# Patient Record
Sex: Male | Born: 1960 | Race: White | Hispanic: No | Marital: Married | Smoking: Former smoker
Health system: Southern US, Academic
[De-identification: ages and names within clinical notes are randomized; demographics above are authoritative.]

## PROBLEM LIST (undated history)

## (undated) DIAGNOSIS — I10 Essential (primary) hypertension: Secondary | ICD-10-CM

## (undated) DIAGNOSIS — N200 Calculus of kidney: Secondary | ICD-10-CM

## (undated) DIAGNOSIS — E785 Hyperlipidemia, unspecified: Secondary | ICD-10-CM

## (undated) DIAGNOSIS — N189 Chronic kidney disease, unspecified: Secondary | ICD-10-CM

## (undated) DIAGNOSIS — H8090 Unspecified otosclerosis, unspecified ear: Secondary | ICD-10-CM

## (undated) HISTORY — PX: LITHOTRIPSY: SUR834

---

## 1989-07-08 ENCOUNTER — Inpatient Hospital Stay (HOSPITAL_COMMUNITY): Payer: Self-pay

## 2019-08-15 IMAGING — MR MRI UPPER EXTREMITY WITHOUT AND WITH CONTRAST LT
12 series · 39 of 40 positions shown · IV contrast (gadavist)
Comparison: None available.

EXAM:  MRI UPPER EXTREMITY WITHOUT AND WITH CONTRAST LT
INDICATION: Bony lesion.
TECHNIQUE: Multiplanar multisequential MRI of the left upper arm was performed without and with 5 mL of Gadavist.

[Series 6: T1 · oblique · left · 5.0mm · 0.66mm/px · 3 of 16 slices shown (1 of 6)]
[im 1/16]
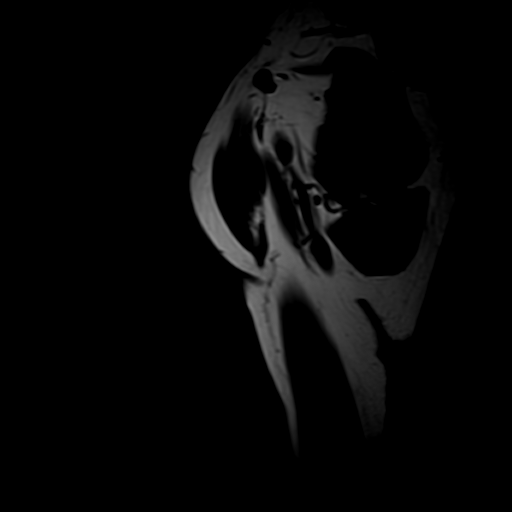
[im 8/16]
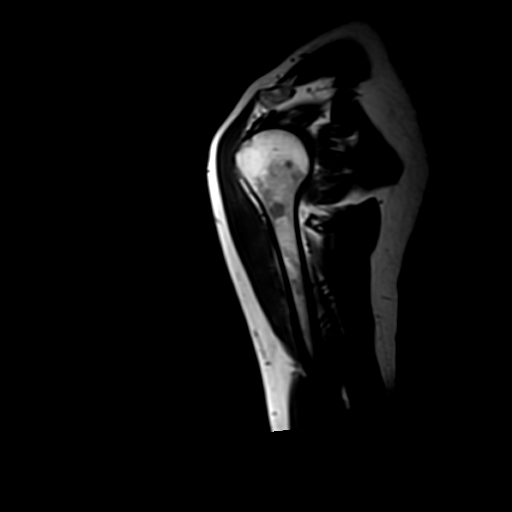
[im 16/16]
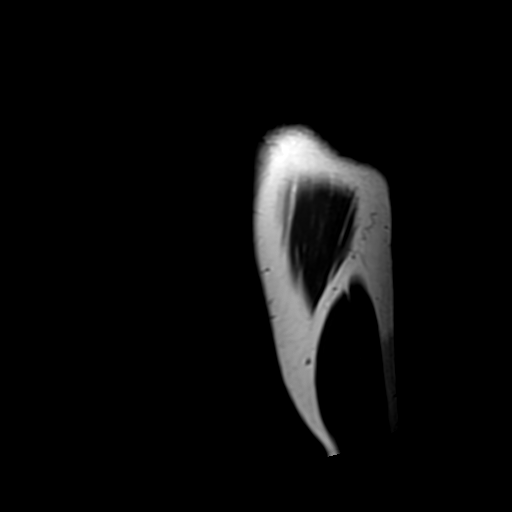

[Series 10: T2 fat-sat · axial · left · 9.0mm · 0.78mm/px · z∈[-212,+108]mm · 6 of 36 slices shown]
[im 1/36]
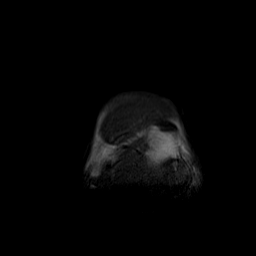
[im 8/36]
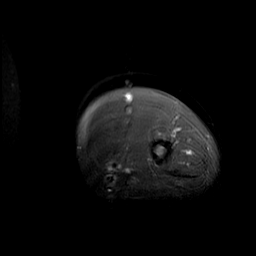
[im 15/36]
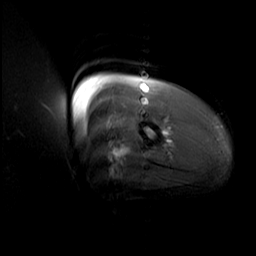
[im 22/36]
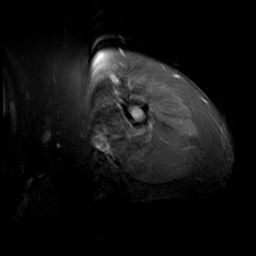
[im 29/36]
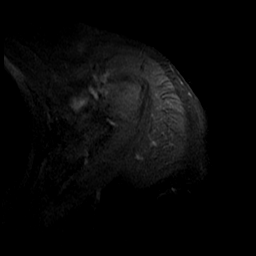
[im 36/36]
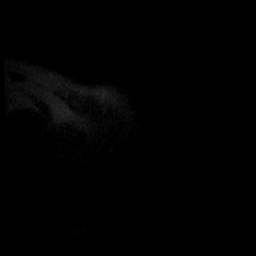

[Series 11: T1 fat-sat · axial · left · 9.0mm · 0.78mm/px · z∈[-102,+108]mm · 4 of 24 slices shown (1 of 4)]
[im 1/24]
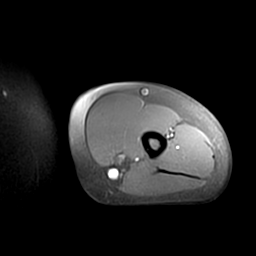
[im 8/24]
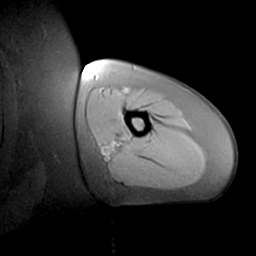
[im 16/24]
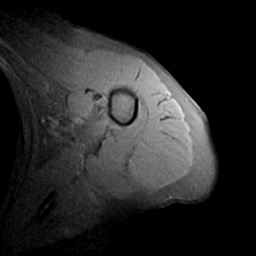
[im 24/24]
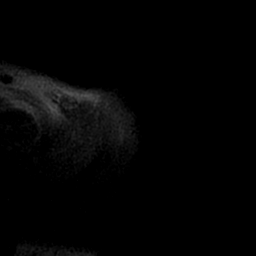

[Series 14: T1 · axial · left · 9.0mm · 0.39mm/px · z∈[-212,+108]mm · 5 of 36 slices shown (2 of 6)]
[im 1/36]
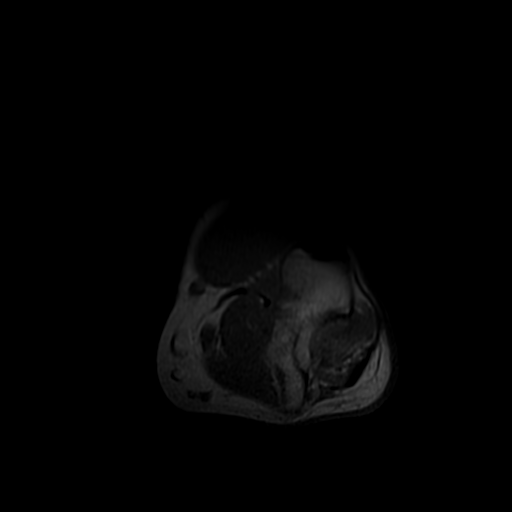
[im 9/36]
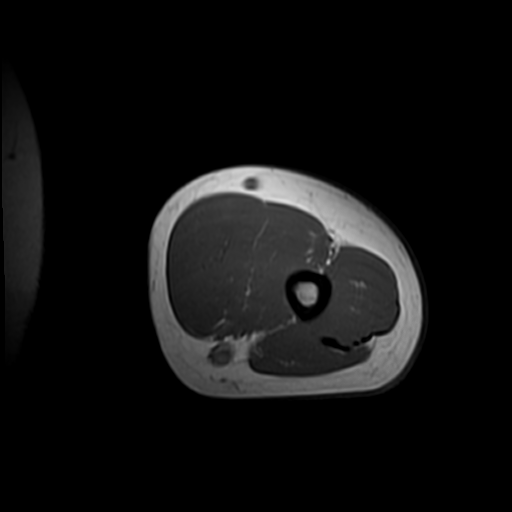
[im 18/36]
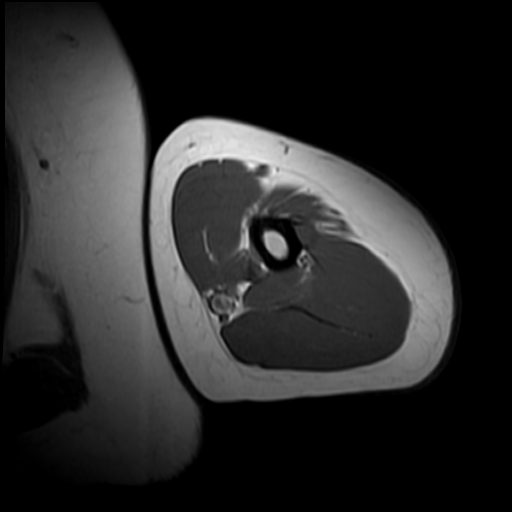
[im 27/36]
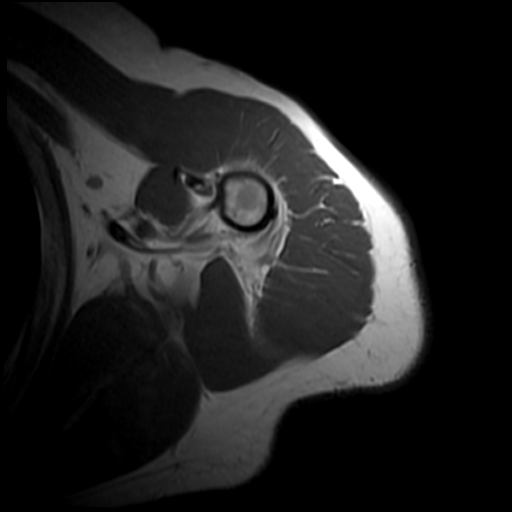
[im 36/36]
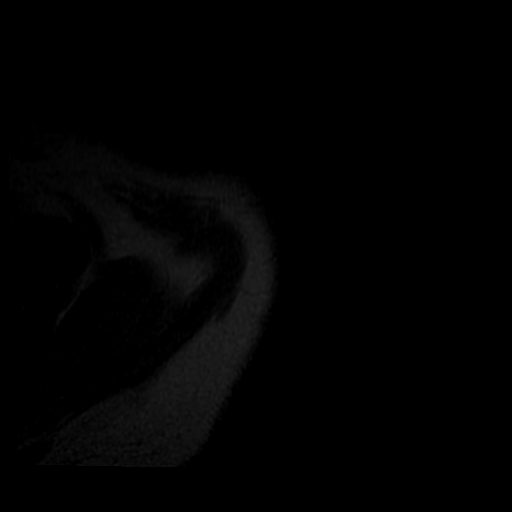

[Series 25: T1 fat-sat · oblique · left · 5.0mm · 1.33mm/px · 2 of 16 slices shown (2 of 4)]
[im 1/16]
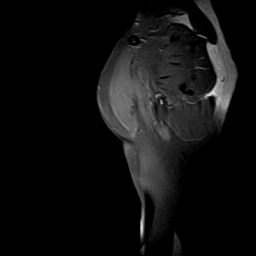
[im 16/16]
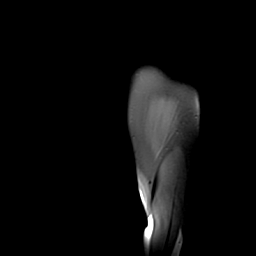

[Series 26: STIR · coronal · left · 4.0mm · 1.37mm/px · 2 of 24 slices shown]
[im 1/24]
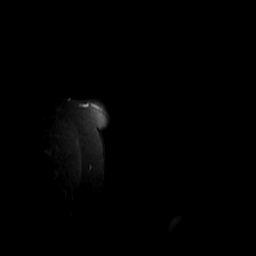
[im 12/24]
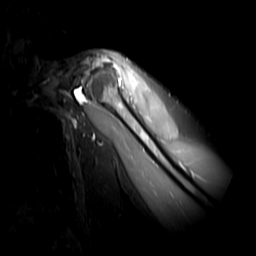

[Series 27: T1 · coronal · left · 4.0mm · 0.68mm/px · 3 of 24 slices shown (3 of 6)]
[im 1/24]
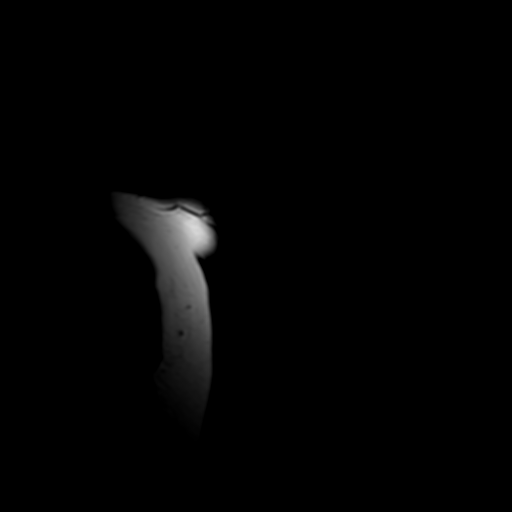
[im 12/24]
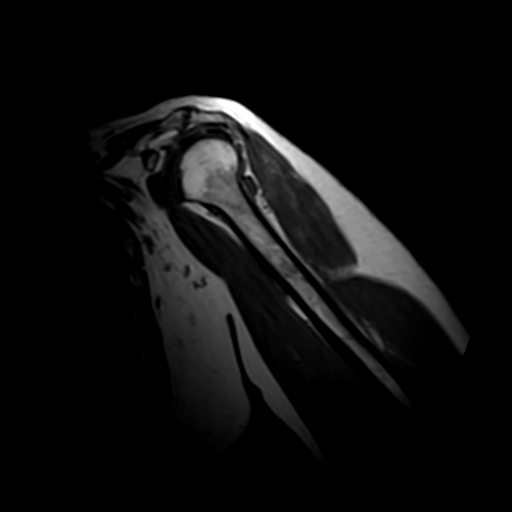
[im 24/24]
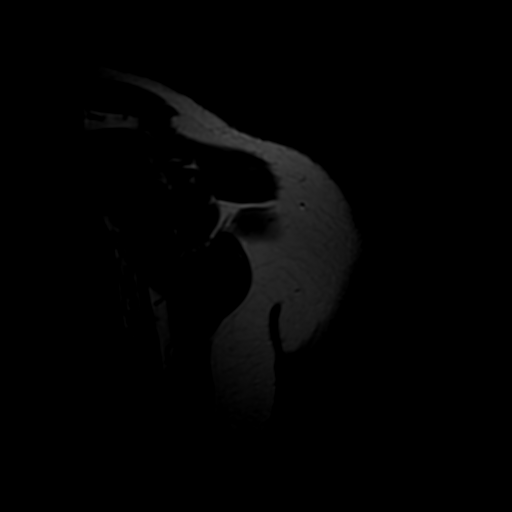

[Series 28: T1 fat-sat · coronal · left · 4.0mm · 1.37mm/px · 3 of 24 slices shown (3 of 4)]
[im 1/24]
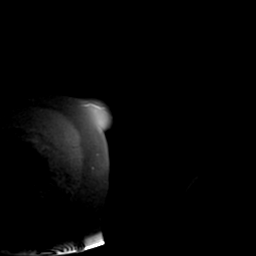
[im 12/24]
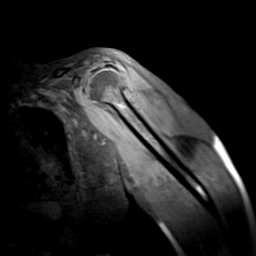
[im 24/24]
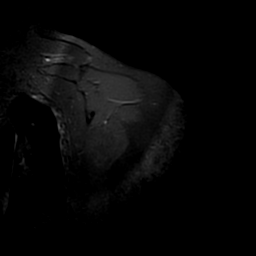

[Series 29: T1 fat-sat · axial · left · 6.0mm · 0.78mm/px · z∈[-119,-8]mm · 3 of 20 slices shown (4 of 4)]
[im 1/20]
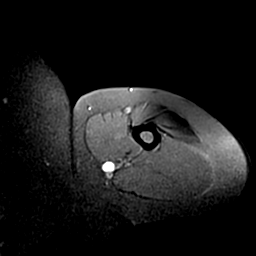
[im 10/20]
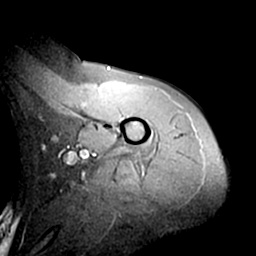
[im 20/20]
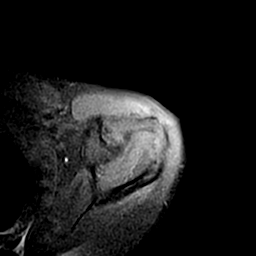

[Series 30: T1 · oblique · left · 5.0mm · 1.33mm/px · 2 of 16 slices shown (4 of 6)]
[im 1/16]
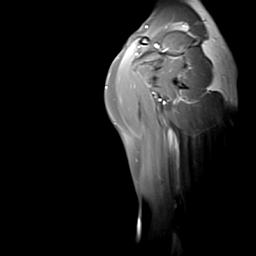
[im 16/16]
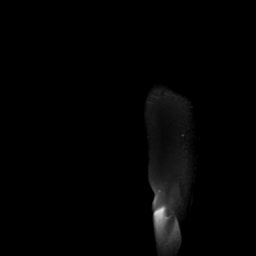

[Series 31: T1 · coronal · left · 4.0mm · 1.37mm/px · 3 of 24 slices shown (5 of 6)]
[im 1/24]
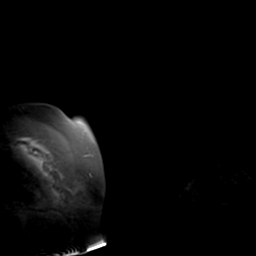
[im 12/24]
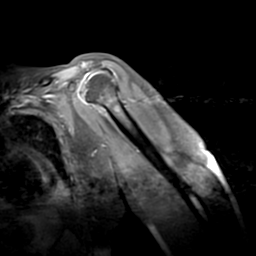
[im 24/24]
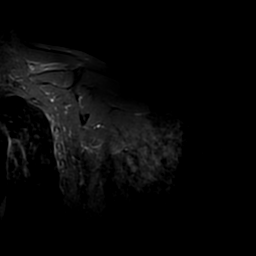

[Series 32: T1 · axial · left · 6.0mm · 0.78mm/px · z∈[-119,-8]mm · 3 of 20 slices shown (6 of 6)]
[im 1/20]
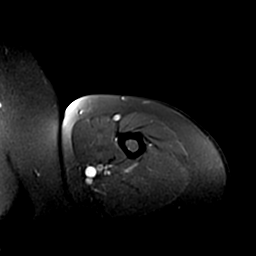
[im 10/20]
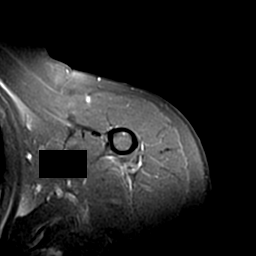
[im 20/20]
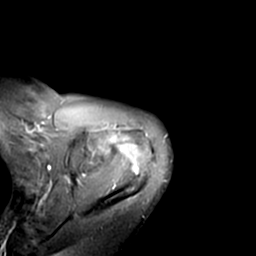

[39 of 40 positions shown; findings below may reference images not displayed]

FINDINGS: There is no acute fracture or subluxation. There is a 10.5 mm mildly T1 hypointense and T2 hyperintense area within the medullary canal of the proximal humeral shaft. There is no surrounding edema. The cortical bone is normal. Following intravenous contrast administration, there is no definite enhancement. Visualized muscles and soft tissues are also normal without definite mass or evidence of denervation atrophy. There is no axillary adenopathy.
IMPRESSION: Likely benign 10.5 mm area within the proximal humeral shaft as detailed above. Follow-up CT scan or MRI is recommended in 3-6 months to document stability.

## 2021-02-10 IMAGING — MR MRI SHOULDER LT W/O CONTRAST
6 of 8 series · 29 of 40 positions shown · IV contrast (gadolinium)
Comparison: MRI of the left humerus dated 09/10/2019.

﻿EXAM:  [id number]   MRI SHOULDER LT W/O CONTRAST
INDICATION: Chronic shoulder pain.
TECHNIQUE: Multiplanar multisequential MRI of the left shoulder joint was performed without gadolinium contrast.

[Series 8: T1 · oblique · left · 4.0mm · 0.31mm/px · 5 of 22 slices shown]
[im 1/22]
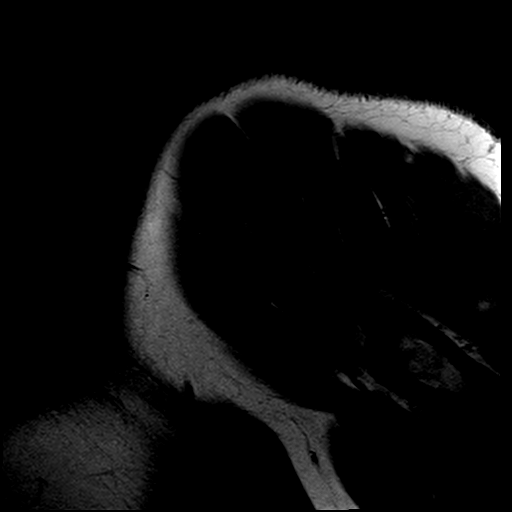
[im 6/22]
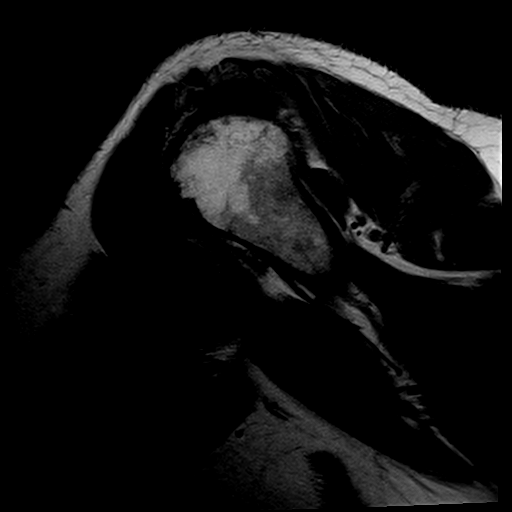
[im 11/22]
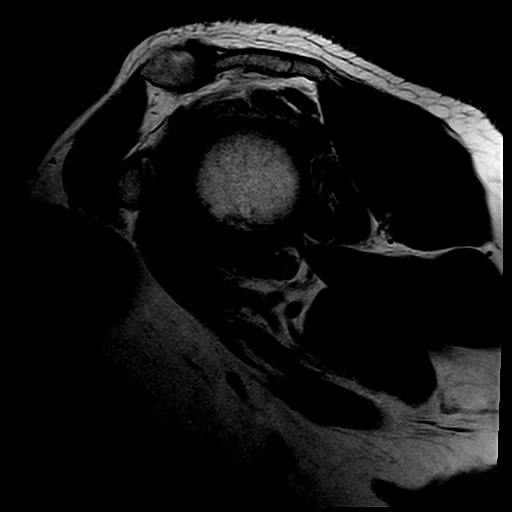
[im 16/22]
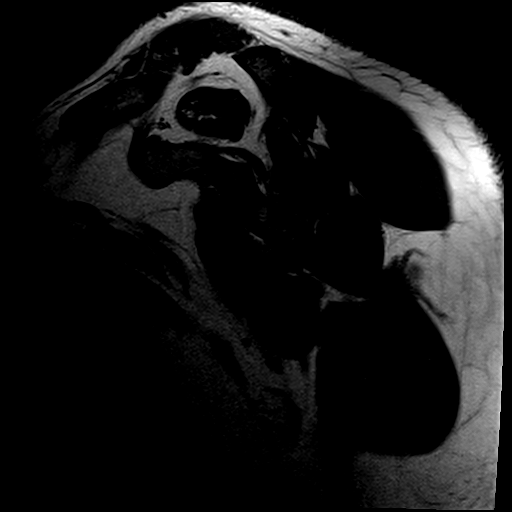
[im 22/22]
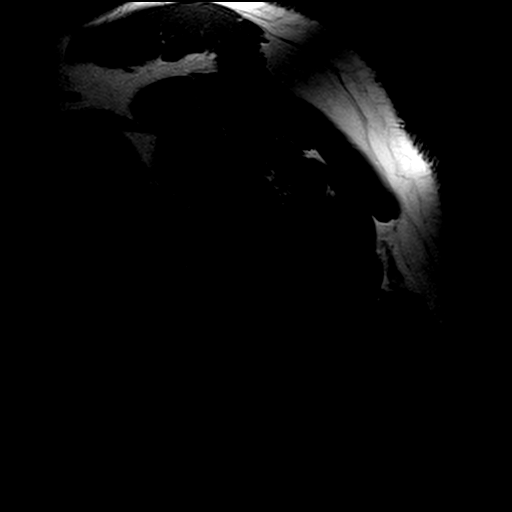

[Series 9: T2 · oblique · left · 4.0mm · 0.42mm/px · 4 of 22 slices shown]
[im 1/22]
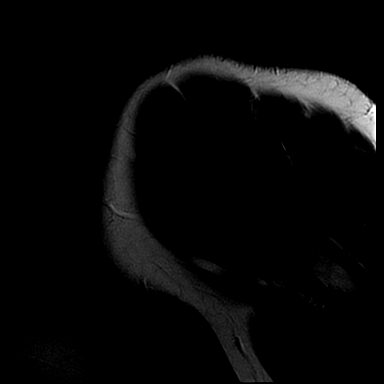
[im 6/22]
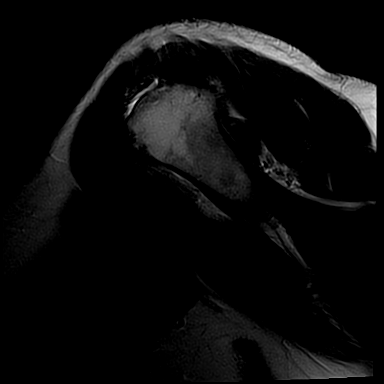
[im 11/22]
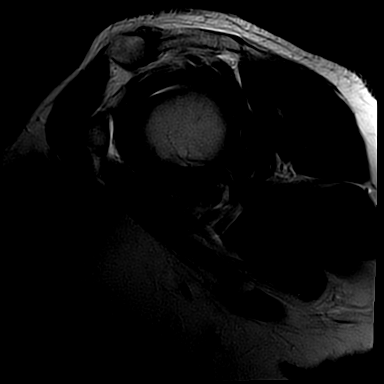
[im 16/22]
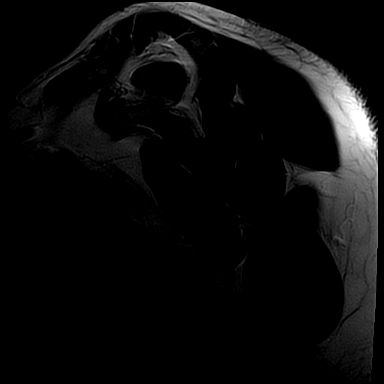

[Series 11: PD fat-sat · axial · left · 4.0mm · 0.36mm/px · z∈[-87,+22]mm · 5 of 24 slices shown (1 of 2)]
[im 1/24]
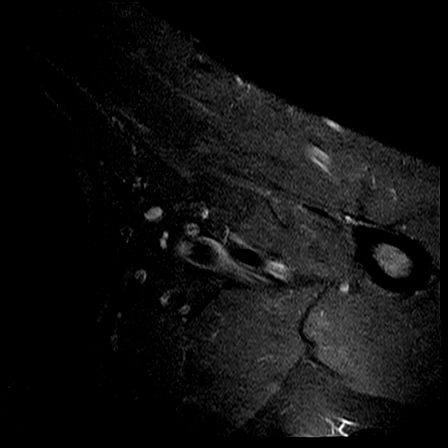
[im 6/24]
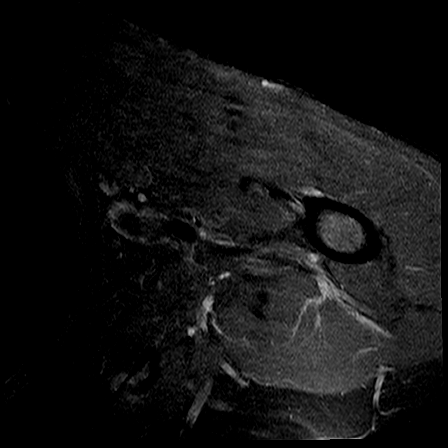
[im 12/24]
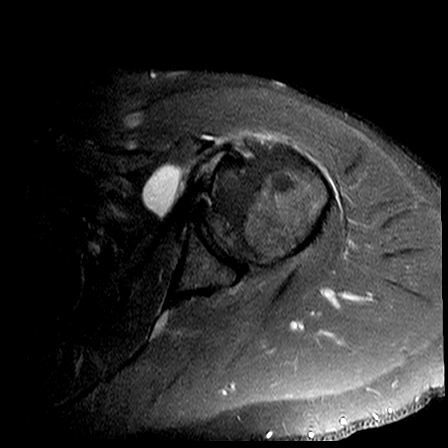
[im 18/24]
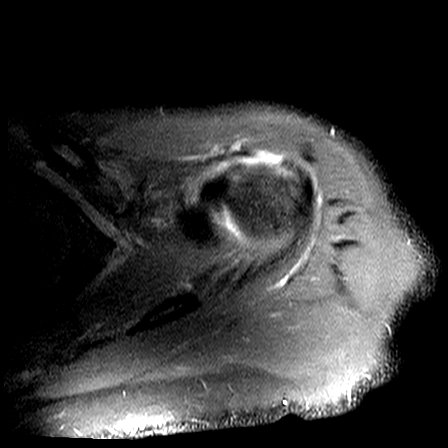
[im 24/24]
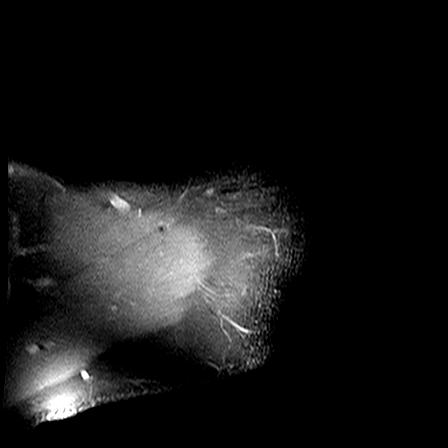

[Series 12: PD fat-sat · oblique · left · 4.0mm · 0.47mm/px · 5 of 23 slices shown (2 of 2)]
[im 1/23]
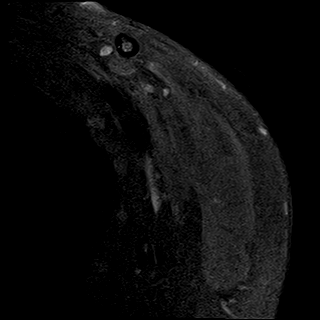
[im 6/23]
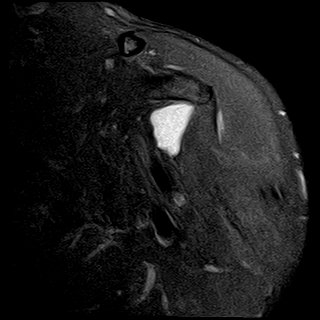
[im 12/23]
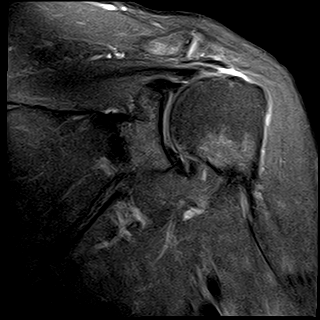
[im 17/23]
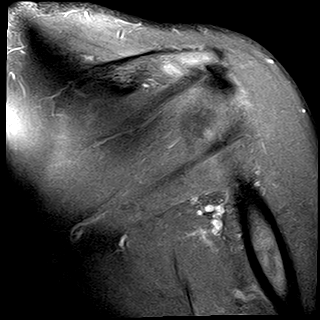
[im 23/23]
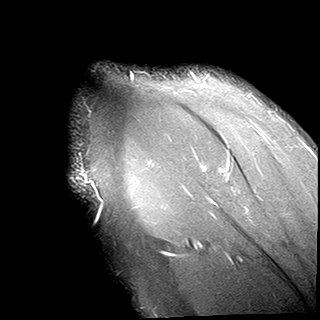

[Series 14: T2 fat-sat · axial · left · 4.0mm · 0.42mm/px · z∈[-87,+22]mm · 5 of 24 slices shown]
[im 1/24]
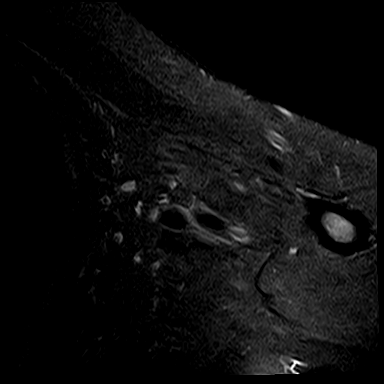
[im 6/24]
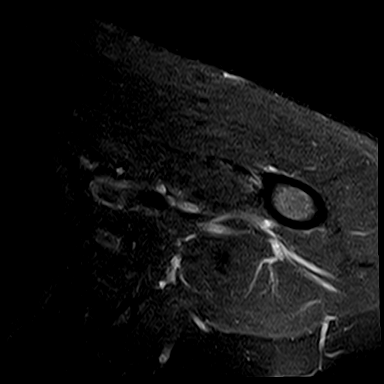
[im 12/24]
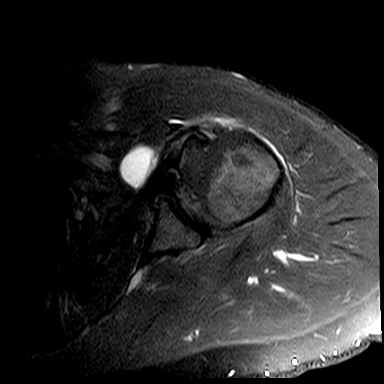
[im 18/24]
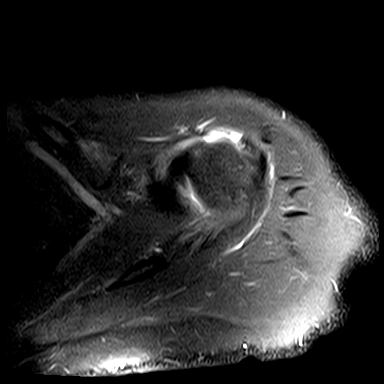
[im 24/24]
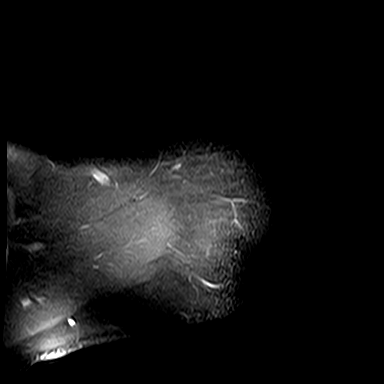

[Series 15: PD · axial · left · 4.0mm · 0.36mm/px · z∈[-87,+22]mm · 5 of 24 slices shown]
[im 1/24]
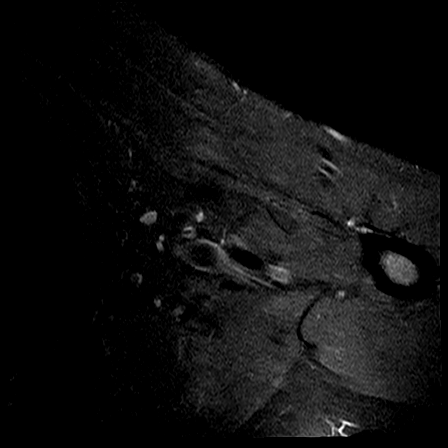
[im 6/24]
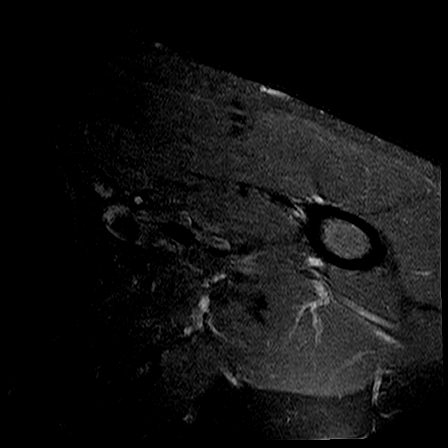
[im 12/24]
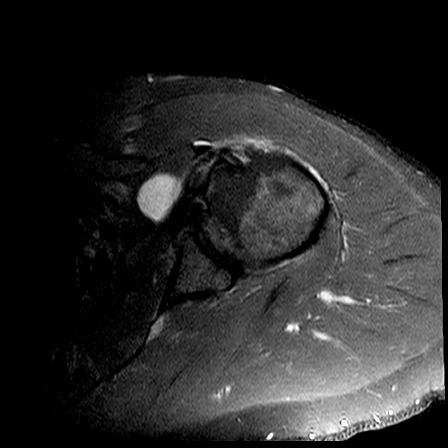
[im 18/24]
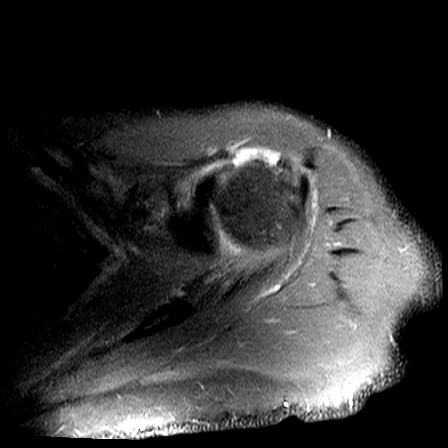
[im 24/24]
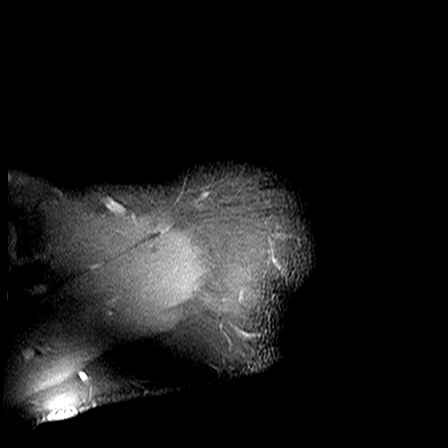

[29 of 40 positions shown; findings below may reference images not displayed]

FINDINGS: There is a large full-thickness supraspinatus tendon tear. Mild infraspinatus tendinopathy is also seen. Teres minor and subscapularis muscles and tendons are within normal limits in morphology and signal intensity. Long head of biceps tendon is well seated within the intertubercular groove and attaches normally to the biceps anchor. Superior labrum is intact. Glenohumeral articular cartilage is well maintained. There is mild acromioclavicular joint osteoarthritis. Trace fluid is noted within the subacromial/subdeltoid bursa. Muscle bulk is within normal limits. Benign appearing vague slightly low T1 signal intensity lesions within the humeral head and proximal shaft are stable and are likely benign. No mass is seen along the course of the suprascapular nerve, within the spinoglenoid notch or within the quadrilateral space.
IMPRESSION: 1. Large full-thickness supraspinatus tendon tear. 

2. Mild infraspinatus tendinopathy. 

3. Mild acromioclavicular joint osteoarthritis.

## 2021-10-12 ENCOUNTER — Emergency Department
Admission: EM | Admit: 2021-10-12 | Discharge: 2021-10-13 | Disposition: A | Discharge status: Home / Self Care | Payer: 59 | Attending: Physician Assistant | Admitting: Physician Assistant

## 2021-10-12 ENCOUNTER — Other Ambulatory Visit: Payer: Self-pay

## 2021-10-12 DIAGNOSIS — Z20822 Contact with and (suspected) exposure to covid-19: Secondary | ICD-10-CM | POA: Insufficient documentation

## 2021-10-12 DIAGNOSIS — K5900 Constipation, unspecified: Secondary | ICD-10-CM | POA: Insufficient documentation

## 2021-10-12 DIAGNOSIS — B349 Viral infection, unspecified: Secondary | ICD-10-CM | POA: Insufficient documentation

## 2021-10-12 DIAGNOSIS — N390 Urinary tract infection, site not specified: Secondary | ICD-10-CM | POA: Insufficient documentation

## 2021-10-12 HISTORY — DX: Chronic kidney disease, unspecified: N18.9

## 2021-10-12 HISTORY — DX: Calculus of kidney: N20.0

## 2021-10-12 HISTORY — DX: Unspecified otosclerosis, unspecified ear: H80.90

## 2021-10-12 HISTORY — DX: Essential (primary) hypertension: I10

## 2021-10-12 HISTORY — DX: Hyperlipidemia, unspecified: E78.5

## 2021-10-12 LAB — COVID-19, FLU A/B, RSV RAPID BY PCR
INFLUENZA VIRUS TYPE A: NOT DETECTED
INFLUENZA VIRUS TYPE B: NOT DETECTED
RESPIRATORY SYNCTIAL VIRUS (RSV): NOT DETECTED
SARS-CoV-2: NOT DETECTED

## 2021-10-12 MED ORDER — ACETAMINOPHEN 325 MG TABLET
975.0000 mg | ORAL_TABLET | ORAL | Status: AC
Start: 2021-10-12 — End: 2021-10-12
  Administered 2021-10-12: 975 mg via ORAL

## 2021-10-12 MED ORDER — ACETAMINOPHEN 325 MG TABLET
ORAL_TABLET | ORAL | Status: AC
Start: 2021-10-12 — End: 2021-10-12
  Filled 2021-10-12: qty 3

## 2021-10-12 NOTE — ED Triage Notes (Signed)
Fever, generalized body aches, nausea

## 2021-10-13 ENCOUNTER — Emergency Department (HOSPITAL_COMMUNITY): Payer: 59

## 2021-10-13 ENCOUNTER — Encounter (HOSPITAL_COMMUNITY): Payer: Self-pay | Admitting: Physician Assistant

## 2021-10-13 LAB — CBC WITH DIFF
BASOPHIL #: 0 10*3/uL (ref 0.00–0.30)
BASOPHIL %: 0 % (ref 0–3)
EOSINOPHIL #: 0 10*3/uL (ref 0.00–0.80)
EOSINOPHIL %: 0 % (ref 0–7)
HCT: 40.4 % — ABNORMAL LOW (ref 42.0–51.0)
HGB: 14 g/dL (ref 13.5–18.0)
LYMPHOCYTE #: 0.7 10*3/uL — ABNORMAL LOW (ref 1.10–5.00)
LYMPHOCYTE %: 8 % — ABNORMAL LOW (ref 25–45)
MCH: 31.3 pg (ref 27.0–32.0)
MCHC: 34.7 g/dL (ref 32.0–36.0)
MCV: 90.3 fL (ref 78.0–99.0)
MONOCYTE #: 0.9 10*3/uL (ref 0.00–1.30)
MONOCYTE %: 10 % (ref 0–12)
MPV: 8.9 fL (ref 7.4–10.4)
NEUTROPHIL #: 6.9 10*3/uL (ref 1.80–8.40)
NEUTROPHIL %: 81 % — ABNORMAL HIGH (ref 40–76)
PLATELETS: 143 10*3/uL (ref 140–440)
RBC: 4.47 10*6/uL (ref 4.20–6.00)
RDW: 13.3 % (ref 11.6–14.8)
WBC: 8.5 10*3/uL (ref 4.0–10.5)
WBCS UNCORRECTED: 8.5 10*3/uL

## 2021-10-13 LAB — COMPREHENSIVE METABOLIC PANEL, NON-FASTING
ALBUMIN/GLOBULIN RATIO: 1.8 — ABNORMAL HIGH (ref 0.8–1.4)
ALBUMIN: 3.9 g/dL (ref 3.5–5.7)
ALKALINE PHOSPHATASE: 37 U/L (ref 34–104)
ALT (SGPT): 14 U/L (ref 7–52)
ANION GAP: 7 mmol/L — ABNORMAL LOW (ref 10–20)
AST (SGOT): 13 U/L (ref 13–39)
BILIRUBIN TOTAL: 0.7 mg/dL (ref 0.3–1.2)
BUN/CREA RATIO: 20 (ref 6–22)
BUN: 26 mg/dL — ABNORMAL HIGH (ref 7–25)
CALCIUM, CORRECTED: 9.6 mg/dL (ref 8.9–10.8)
CALCIUM: 9.5 mg/dL (ref 8.6–10.3)
CHLORIDE: 102 mmol/L (ref 98–107)
CO2 TOTAL: 26 mmol/L (ref 21–31)
CREATININE: 1.3 mg/dL (ref 0.60–1.30)
ESTIMATED GFR: 63 mL/min/{1.73_m2} (ref >59)
GLOBULIN: 2.2 — ABNORMAL LOW (ref 2.9–5.4)
GLUCOSE: 103 mg/dL (ref 74–109)
OSMOLALITY, CALCULATED: 275 mOsm/kg (ref 270–290)
POTASSIUM: 4.2 mmol/L (ref 3.5–5.1)
PROTEIN TOTAL: 6.1 g/dL — ABNORMAL LOW (ref 6.4–8.9)
SODIUM: 135 mmol/L — ABNORMAL LOW (ref 136–145)

## 2021-10-13 LAB — URINALYSIS, MICROSCOPIC
RBCS: 3 /hpf (ref <4)
SQUAMOUS EPITHELIAL: <1 /hpf (ref <28)
WBCS: 33 /hpf — ABNORMAL HIGH (ref <6)

## 2021-10-13 LAB — URINALYSIS, MACROSCOPIC
BILIRUBIN: NEGATIVE mg/dL
BLOOD: NEGATIVE mg/dL
GLUCOSE: NEGATIVE mg/dL
KETONES: NEGATIVE mg/dL
LEUKOCYTES: 75 WBCs/uL — AB
NITRITE: NEGATIVE
PH: 5.5 (ref 5.0–9.0)
PROTEIN: 10 mg/dL
SPECIFIC GRAVITY: 1.019 (ref 1.002–1.030)
UROBILINOGEN: NORMAL mg/dL

## 2021-10-13 LAB — LACTIC ACID LEVEL W/ REFLEX FOR LEVEL >2.0: LACTIC ACID: 1 mmol/L (ref 0.5–2.2)

## 2021-10-13 LAB — C-REACTIVE PROTEIN(CRP),INFLAMMATION: C-REACTIVE PROTEIN (CRP): 4.5 mg/dL — ABNORMAL HIGH (ref 0.1–0.5)

## 2021-10-13 LAB — LIGHT GREEN TOP TUBE

## 2021-10-13 LAB — BLUE TOP TUBE

## 2021-10-13 MED ORDER — CEFDINIR 300 MG CAPSULE
300.0000 mg | ORAL_CAPSULE | Freq: Two times a day (BID) | ORAL | 0 refills | Status: AC
Start: 2021-10-13 — End: ?

## 2021-10-13 MED ORDER — ONDANSETRON 4 MG DISINTEGRATING TABLET
4.0000 mg | ORAL_TABLET | Freq: Four times a day (QID) | ORAL | 0 refills | Status: AC | PRN
Start: 2021-10-13 — End: ?

## 2021-10-13 MED ORDER — SODIUM CHLORIDE 0.9 % IV BOLUS
1000.0000 mL | INJECTION | Status: AC
Start: 2021-10-13 — End: 2021-10-13
  Administered 2021-10-13: 1000 mL via INTRAVENOUS

## 2021-10-13 NOTE — ED Nurses Note (Signed)
Pt was given discharge paperwork with 2 printed scripts.  Questions and concerns were addressed at this time.  IV was taken out and intact.  Pt left department via ambulation.

## 2021-10-13 NOTE — ED Provider Notes (Signed)
Emergency Medicine      Name: Benjamin Mercer  Age and Gender: 61 y.o. male  Date of Birth: June 13, 1960  MRN: [id number]  PCP: Pcp Not In System    CC:  Chief Complaint   Patient presents with    Fever       HPI:  Benjamin Mercer is a 61 y.o. White male who presents to the ER with fever since yesterday. He states he has had chills, body aches, a mild nagging headache, nasal congestion, nausea at times, intermittent abdominal pains. He says he has had a few episodes of dizziness since yesterday that he describes as feeling unsteady on his feet. He denies ear pain, sore throat, cough, shortness of breath, chest pain, vomiting, urinary symptoms, or skin rashes. He does report some diarrhea for 2 days but states this is typical, as he has alternated between diarrhea and constipation for the past 2 months. He denies any known sick contacts or insect bites.            Below pertinent information reviewed with patient:  Past Medical History:   Diagnosis Date    CKD (chronic kidney disease)     Essential hypertension     Hyperlipidemia     Kidney stones     Otosclerosis            No Known Allergies    Past Surgical History:   Procedure Laterality Date    LITHOTRIPSY              Social History     Socioeconomic History    Marital status: Married       ROS:  No other overt positive review of systems are noted other than stated in the HPI.      Objective:    ED Triage Vitals [10/12/21 2155]   BP (Non-Invasive) 126/62   Heart Rate 95   Respiratory Rate 20   Temperature (!) 38.8 C (101.8 F)   SpO2 93 %   Weight 99.8 kg (220 lb)   Height 1.829 m (6')     Filed Vitals:    10/13/21 0245 10/13/21 0300 10/13/21 0440 10/13/21 0458   BP: 122/68 127/72  125/70   Pulse:    92   Resp:    18   Temp:   37.6 C (99.7 F) 37.6 C (99.7 F)   SpO2:    94%       Nursing notes and vital signs reviewed.     Constitutional - No acute distress.  Alert and Active.  HEENT - Normocephalic. Atraumatic. PERRL. EOMI. Conjunctiva clear. TM's pearly  grey, dull with retraction. Oropharynx with no erythema, lesions, or exudates. Moist mucous membranes.   Neck - Trachea midline. No stridor. No hoarseness. Full ROM.  Cardiac - Regular rate and rhythm. No murmurs, rubs, or gallops.   Respiratory/Chest - Normal respiratory effort. Clear to auscultation bilaterally. Rales noted in bilateral lower lung fields.  Abdomen - Normal bowel sounds. Non-tender, soft, non-distended. No rebound or guarding.   Musculoskeletal - Good AROM. No muscle or joint tenderness appreciated. No clubbing, cyanosis or edema.  Skin - Warm and dry, without any rashes or other lesions.  Neuro - Alert and oriented x 3. Moving all extremities symmetrically.   Psych - Normal mood and affect. Behavior is normal    Any pertinent labs and imaging obtained during this encounter reviewed below in MDM.    MDM/ED Course:  Medical Decision Making  Patient presented to the ER with complaints including fever, chills, body aches, headache, nasal congestion, with intermittent nausea and abdominal pains since yesterday. He reported having 3 sporadic episodes of dizziness, which he described as feeling off balance. He was evaluated and underwent evaluation with labs, ua, respiratory 4 plex, and x-rays. He was found to have an elevated crp, mildly elevated BUN, and mild UTI with 75 leukocytes and 33 wbc on UA. He was given 1L NS bolus and acetaminophen while in the ER. He will be discharged home in stable condition with prescriptions for omnicef and Zofran, with instructions to see his PCP for a repeat UA after completing antibiotics.     Amount and/or Complexity of Data Reviewed  Labs: ordered. Decision-making details documented in ED Course.  Radiology: ordered. Decision-making details documented in ED Course.  ECG/medicine tests: independent interpretation performed.            ED Course as of 10/13/21 0505   Tue Oct 13, 2021   0259 C-REACTIVE PROTEIN (CRP)(!): 4.5  elevated   0300 BUN(!): 26  Mildly  elevated     0300 COVID-19, FLU A/B, RSV RAPID BY PCR  All negative   0300 LACTIC ACID: 1.0  normal   0301 URINALYSIS, MACROSCOPIC AND MICROSCOPIC W/CULTURE REFLEX(!)  Mild UTI with 75 leukocytes and 33 wbc   0301 WBC: 8.5  normal   0447 XR CHEST PA AND LATERAL  No acute abnormalities   0448 XR KUB AND UPRIGHT ABDOMEN  Excessive colonic stool content.  Nonobstructive bowel gas pattern       Orders Placed This Encounter    ADULT ROUTINE BLOOD CULTURE, SET OF 2 ADULT BOTTLES (BACTERIA AND YEAST)    ADULT ROUTINE BLOOD CULTURE, SET OF 2 ADULT BOTTLES (BACTERIA AND YEAST)    URINE CULTURE,ROUTINE    XR CHEST PA AND LATERAL    CANCELED: XR ABD FLAT AND UPRIGHT SERIES (W AP CHEST)    XR KUB AND UPRIGHT ABDOMEN    COVID-19, FLU A/B, RSV RAPID BY PCR    CBC/DIFF    COMPREHENSIVE METABOLIC PANEL, NON-FASTING    URINALYSIS, MACROSCOPIC AND MICROSCOPIC W/CULTURE REFLEX    LACTIC ACID LEVEL W/ REFLEX FOR LEVEL >2.0    C-REACTIVE PROTEIN(CRP),INFLAMMATION    CBC WITH DIFF    URINALYSIS, MACROSCOPIC    URINALYSIS, MICROSCOPIC    EXTRA TUBES    BLUE TOP TUBE    LIGHT GREEN TOP TUBE    acetaminophen (TYLENOL) tablet    NS bolus infusion 1,000 mL    cefdinir (OMNICEF) 300 mg Oral Capsule    ondansetron (ZOFRAN ODT) 4 mg Oral Tablet, Rapid Dissolve           Impression:   Clinical Impression   Acute viral syndrome (Primary)   Constipation, unspecified constipation type   UTI (urinary tract infection)       Disposition: Discharged      Portions of this note may have been dictated using voice recognition software.     Maryagnes Amos PA-C    -----------------------  Results for orders placed or performed during the hospital encounter of 10/12/21 (from the past 12 hour(s))   COVID-19, FLU A/B, RSV RAPID BY PCR   Result Value Ref Range    SARS-CoV-2 Not Detected Not Detected    INFLUENZA VIRUS TYPE A Not Detected Not Detected    INFLUENZA VIRUS TYPE B Not Detected Not Detected    RESPIRATORY SYNCTIAL VIRUS (RSV) Not Detected  Not Detected    COMPREHENSIVE METABOLIC PANEL, NON-FASTING   Result Value Ref Range    SODIUM 135 (L) 136 - 145 mmol/L    POTASSIUM 4.2 3.5 - 5.1 mmol/L    CHLORIDE 102 98 - 107 mmol/L    CO2 TOTAL 26 21 - 31 mmol/L    ANION GAP 7 (L) 10 - 20 mmol/L    BUN 26 (H) 7 - 25 mg/dL    CREATININE 3.53 6.14 - 1.30 mg/dL    BUN/CREA RATIO 20 6 - 22    ESTIMATED GFR 63 >59 mL/min/1.58m^2    ALBUMIN 3.9 3.5 - 5.7 g/dL    CALCIUM 9.5 8.6 - 43.1 mg/dL    GLUCOSE 540 74 - 086 mg/dL    ALKALINE PHOSPHATASE 37 34 - 104 U/L    ALT (SGPT) 14 7 - 52 U/L    AST (SGOT) 13 13 - 39 U/L    BILIRUBIN TOTAL 0.7 0.3 - 1.2 mg/dL    PROTEIN TOTAL 6.1 (L) 6.4 - 8.9 g/dL    ALBUMIN/GLOBULIN RATIO 1.8 (H) 0.8 - 1.4    OSMOLALITY, CALCULATED 275 270 - 290 mOsm/kg    CALCIUM, CORRECTED 9.6 8.9 - 10.8 mg/dL    GLOBULIN 2.2 (L) 2.9 - 5.4   LACTIC ACID LEVEL W/ REFLEX FOR LEVEL >2.0   Result Value Ref Range    LACTIC ACID 1.0 0.5 - 2.2 mmol/L   C-REACTIVE PROTEIN(CRP),INFLAMMATION   Result Value Ref Range    C-REACTIVE PROTEIN (CRP) 4.5 (H) 0.1 - 0.5 mg/dL   CBC WITH DIFF   Result Value Ref Range    WBCS UNCORRECTED 8.5 x10^3/uL    WBC 8.5 4.0 - 10.5 x10^3/uL    RBC 4.47 4.20 - 6.00 x10^6/uL    HGB 14.0 13.5 - 18.0 g/dL    HCT 76.1 (L) 95.0 - 51.0 %    MCV 90.3 78.0 - 99.0 fL    MCH 31.3 27.0 - 32.0 pg    MCHC 34.7 32.0 - 36.0 g/dL    RDW 93.2 67.1 - 24.5 %    PLATELETS 143 140 - 440 x10^3/uL    MPV 8.9 7.4 - 10.4 fL    NEUTROPHIL % 81 (H) 40 - 76 %    LYMPHOCYTE % 8 (L) 25 - 45 %    MONOCYTE % 10 0 - 12 %    EOSINOPHIL % 0 0 - 7 %    BASOPHIL % 0 0 - 3 %    NEUTROPHIL # 6.90 1.80 - 8.40 x10^3/uL    LYMPHOCYTE # 0.70 (L) 1.10 - 5.00 x10^3/uL    MONOCYTE # 0.90 0.00 - 1.30 x10^3/uL    EOSINOPHIL # 0.00 0.00 - 0.80 x10^3/uL    BASOPHIL # 0.00 0.00 - 0.30 x10^3/uL   BLUE TOP TUBE   Result Value Ref Range    RAINBOW/EXTRA TUBE AUTO RESULT Yes    URINALYSIS, MACROSCOPIC   Result Value Ref Range    COLOR Light Yellow Colorless, Light Yellow, Yellow    APPEARANCE Clear  Clear    SPECIFIC GRAVITY 1.019 1.002 - 1.030    PH 5.5 5.0 - 9.0    LEUKOCYTES 75 (A) Negative, 100  WBCs/uL    NITRITE Negative Negative    PROTEIN 10 Negative, 10 , 20  mg/dL    GLUCOSE Negative Negative, 30  mg/dL    KETONES Negative Negative, Trace mg/dL    BILIRUBIN Negative Negative, 0.5 mg/dL    BLOOD Negative Negative, 0.03  mg/dL    UROBILINOGEN Normal Normal mg/dL   URINALYSIS, MICROSCOPIC   Result Value Ref Range    MUCOUS Rare (A) (none) /hpf    RBCS 3 <4 /hpf    WBCS 33 (H) <6 /hpf    SQUAMOUS EPITHELIAL <1 <28 /hpf     XR CHEST PA AND LATERAL    (Results Pending)   XR KUB AND UPRIGHT ABDOMEN    (Results Pending)

## 2021-10-13 NOTE — Discharge Instructions (Signed)
Take cefdinir as directed until gone. Take Zofran as needed for nausea. Control fever by alternating Tylenol and Motrin. Drink plenty of fluids and get plenty of rest. See your PCP for a repeat urinalysis after you have completed antibiotics. Return to the ER for other emergencies or as needed.

## 2021-10-15 LAB — URINE CULTURE,ROUTINE: URINE CULTURE: >100000 — AB

## 2021-10-18 LAB — ADULT ROUTINE BLOOD CULTURE, SET OF 2 BOTTLES (BACTERIA AND YEAST)
BLOOD CULTURE, ROUTINE: NO GROWTH
BLOOD CULTURE, ROUTINE: NO GROWTH

## 2021-10-19 ENCOUNTER — Other Ambulatory Visit: Payer: Self-pay

## 2021-12-03 ENCOUNTER — Ambulatory Visit (INDEPENDENT_AMBULATORY_CARE_PROVIDER_SITE_OTHER): Payer: 59 | Admitting: Urology

## 2021-12-03 ENCOUNTER — Other Ambulatory Visit: Payer: Self-pay

## 2021-12-03 ENCOUNTER — Encounter (INDEPENDENT_AMBULATORY_CARE_PROVIDER_SITE_OTHER): Payer: Self-pay | Admitting: Urology

## 2021-12-03 VITALS — BP 145/76 | HR 71 | Ht 72.0 in | Wt 221.0 lb

## 2021-12-03 DIAGNOSIS — N138 Other obstructive and reflux uropathy: Secondary | ICD-10-CM

## 2021-12-03 DIAGNOSIS — N401 Enlarged prostate with lower urinary tract symptoms: Secondary | ICD-10-CM

## 2021-12-03 DIAGNOSIS — N200 Calculus of kidney: Secondary | ICD-10-CM

## 2021-12-03 DIAGNOSIS — N529 Male erectile dysfunction, unspecified: Secondary | ICD-10-CM

## 2021-12-03 DIAGNOSIS — Z125 Encounter for screening for malignant neoplasm of prostate: Secondary | ICD-10-CM

## 2021-12-03 NOTE — H&P (Signed)
UROLOGY, [facility]  [address] [place] [address]      History and Physical     Name: Benjamin Mercer MRN:  [id number]   Date: 12/03/2021 Age: 61 y.o.     12/03/2021    Encounter Provider: Teodoro Spray, MD  Referring Provider: No ref. provider found    History of Present Illness:   Benjamin Mercer is a 61 y.o. year-old male who presents as a new patient referred from the [facility] to establish local urologic care and be monitored for multiple urologic issues:  History of nephrolithiasis, BPH/LUTS, ED, and prostate cancer screening.    Patient reports longstanding history of kidney stones, has undergone numerous 10+ stone surgeries in the past, both ESWL and URS/LLT; last episode was proximally 18 months ago, this stone passed spontaneously on medical therapy.  Patient has known calcium oxalate stones.  No recent surveillance imaging.  Patient admits to not hydrating very well, does drink lots of caffeinated coffee.  Also consumes citrate via lemons.    Patient with history of BPH/LUTS, has been on Flomax for the past 3-4 years, reports good symptomatic control.  Denies any voiding difficulty or bothersome urinary symptoms.  Denies dysuria or hematuria.      Patient with mild ED, has been on Cialis 5 mg as needed (takes 1/2 of 10 mg tablet), this yields good 7/10 strength erections suitable for intercourse and climax.    Patient also has had regular prostate cancer PSA screening, most recently low at 0.65 on 09/14/2021.      Past Medical History:   Diagnosis Date    CKD (chronic kidney disease)     Essential hypertension     Hyperlipidemia     Kidney stones     Otosclerosis           Past Surgical History:   Procedure Laterality Date    LITHOTRIPSY           Social History     Socioeconomic History    Marital status: Married     Spouse name: Not on file    Number of children: Not on file    Years of education: Not on file    Highest education level: Not on file    Occupational History    Not on file   Tobacco Use    Smoking status: Former     Types: Cigarettes     Quit date: 2013     Years since quitting: 10.7    Smokeless tobacco: Former     Quit date: 2013   Substance and Sexual Activity    Alcohol use: Not on file    Drug use: Not on file    Sexual activity: Not on file   Other Topics Concern    Not on file   Social History Narrative    Not on file     Social Determinants of Health     Financial Resource Strain: Not on file   Transportation Needs: Not on file   Social Connections: Not on file   Intimate Partner Violence: Not on file   Housing Stability: Not on file     Family Medical History:    None          No Known Allergies     Current Outpatient Medications   Medication Sig Dispense Refill    aspirin 325 mg Oral Tablet Take 1 Tablet (325 mg total) by mouth Once  a day      buPROPion (WELLBUTRIN SR) 150 mg Oral tablet sustained-release 12 hr 1 Tablet (150 mg total)      cefdinir (OMNICEF) 300 mg Oral Capsule Take 1 Capsule (300 mg total) by mouth Twice daily 20 Capsule 0    cholecalciferol, vitamin D3, 1,250 mcg (50,000 unit) Oral Capsule Take by mouth      cyclobenzaprine (FLEXERIL) 10 mg Oral Tablet 1 Tablet (10 mg total)      diclofenac sodium (VOLTAREN) 1 % Gel APPLY 4 GRAMS TOPICALLY FOUR TIMES DAILY FOR JOINT PAIN      DULoxetine (CYMBALTA DR) 30 mg Oral Capsule, Delayed Release(E.C.) 3 Capsules (90 mg total)      hydroCHLOROthiazide (HYDRODIURIL) 25 mg Oral Tablet Take 1 Tablet (25 mg total) by mouth Every morning      lidocaine (XYLOCAINE) 5 % Ointment APPLY A MODERATE AMOUNT TOPICALLY TWICE A DAY AS NEEDED FOR PAIN      lisinopriL (PRINIVIL) 20 mg Oral Tablet 1 Tablet (20 mg total)      naproxen (NAPROSYN) 500 mg Oral Tablet Take 1 Tablet (500 mg total) by mouth Twice per day as needed      ondansetron (ZOFRAN ODT) 4 mg Oral Tablet, Rapid Dissolve Take 1 Tablet (4 mg total) by mouth Every 6 hours as needed for Nausea/Vomiting 12 Tablet 0    rosuvastatin  (CRESTOR) 20 mg Oral Tablet 0.5 Tablets (10 mg total)      SUMAtriptan (IMITREX) 25 mg Oral Tablet 1 Tablet (25 mg total)      tadalafil (CIALIS) 10 mg Oral Tablet 1 Tablet (10 mg total)      tamsulosin (FLOMAX) 0.4 mg Oral Capsule 1 Capsule (0.4 mg total)      traMADoL (ULTRAM) 50 mg Oral Tablet 1 Tablet (50 mg total)       No current facility-administered medications for this visit.         Review of Systems:   General: No fever or chills. No weight changes, fatigue, weakness.   HEENT: No headaches, dizziness, changes in vision, changes in hearing, or difficulty swallowing.    Skin:  No rashes, erythema or bruises.   Cardiac: No chest pain, palpitations, or arrhythmia.    Respiratory: No shortness of breath, cough, or wheezing.  GI: No nausea or vomiting. No abdominal pain.   Urinary: No frequency, dysuria, or hematuria.   Vascular: No edema, pallor, circulation issues     Musculoskeletal: No muscle weakness, pain, or decreased range of motion.   Neurologic: No loss of sensation, numbness or tingling.   Endocrine: No heat or cold intolerance or polydipsia.   Psychiatric: No insomnia, depression or anxiety.    Physical Exam:   General: Patient is alert and grossly oriented. No acute distress. Communicates appropriately.   Head: Normocephalic and atraumatic.    Eyes: Extraocular movements intact.  Neck: Supple. Trachea midline.  Heart: Regular rate. No peripheral edema.  Lungs: Equal chest excursion. No respiratory distress.   Abdomen: Soft, nontender, nondistended.   Extremities: Normal range of motion  Skin: Warm and dry    Neurologic: Speech intact, no deficits noted   Psychiatric: Judgment and insight are intact. Mood and affect are appropriate for the situation.  Genitourinary:  --Flank: No flank tenderness bilaterally  --Bladder: Soft, non-distended, no tenderness  --Penis:  Normal, circumcised, no lesions or tenderness  --Urethral meatus: Normal, patent, no lesions or discharge  --Scrotum:  Skin normal, no  rashes or lesions   --Testicles:  Normal descended testes bilaterally, consistency normal, no masses or tenderness  --Epididymis:  Normal bilaterally, no tenderness  --DRE:  Deferred      Urinalysis (automated dipstick):    Urine Dip Results:  Color (Ref Range: Yellow): Yellow (12/03/21 1100)  Clarity (Ref Range: Clear): Clear (12/03/21 1100)  Time collected: 1120 (12/03/21 1100)  Glucose (Ref Range: Negative mg/dL): Negative (33/82/50 5397)  Bilirubin (Ref Range: Negative mg/dL): Negative (67/34/19 3790)  Ketones (Ref Range: Negative mg/dL): Negative (24/09/73 5329)  Urine Specific Gravity (Ref Range: 1.005 - 1.030): 1.030 (12/03/21 1100)  Blood (urine) (Ref Range: Negative mg/dL): Negative (92/42/68 3419)  pH (Ref Range: 5.0 - 8.0): 5.5 (12/03/21 1100)  Protein (Ref Range: Negative mg/dL): Negative (62/22/97 9892)  Urobilinogen (Ref Range: Negative mg/dL): 0.2mg /dL (Normal) (11/94/17 4081)  Nitrite (Ref Range: Negative): Negative (12/03/21 1100)  Leukocytes (Ref Range: Negative WBC's/uL): Negative (12/03/21 1100)      Post-void residual, by bladder scan:  PVR Volume: 16 cc (12/03/21 1100)      Additional results:  PSA, 09/14/2021:  0.65        Assessment:   Assessment/Plan   1. Nephrolithiasis    2. BPH with obstruction/lower urinary tract symptoms    3. Erectile dysfunction, unspecified erectile dysfunction type    4. Prostate cancer screening         Plan:   1. Long history of nephrolithiasis, multiple 10+ stone surgeries including ESWL and URS/LLT, last stone episode proximally 18 months ago and passed spontaneously, no recent imaging.    --ordering updated stone surveillance with renal ultrasound  --reviewed stone prevention dietary modifications, strongly emphasized increase in hydration and reduction in caffeinated coffee intake, as well as to more closely monitor his diet    2. BPH/LUTS are well-controlled on Flomax, taken for the past 3-4 years.  No voiding difficulties or bothersome urinary symptoms.  PVR  very low at 16 cc.  --continue Flomax    3. Mild ED, Cialis 5 mg (1/2 of 10 mg tablet) works well yielding 7/10 strength erections suitable for intercourse and climax.  --continue Cialis as needed    4. PSA low and normal at 0.65 on 09/14/2021.  DRE today deferred.    --continue with annual prostate cancer PSA screening      Patient can follow up in 1 year with repeat surveillance renal ultrasound prior, unless any interim issues or otherwise indicated by imaging being ordered at present.        Teodoro Spray, MD       This note may have been fully or partially generated using MModal Fluency Direct system, and there may be some incorrect words, spellings, and punctuation that were not identified in checking the note before saving.

## 2022-02-08 ENCOUNTER — Ambulatory Visit (HOSPITAL_COMMUNITY): Payer: Self-pay

## 2022-04-22 ENCOUNTER — Other Ambulatory Visit: Payer: Self-pay

## 2022-04-22 ENCOUNTER — Inpatient Hospital Stay
Admission: RE | Admit: 2022-04-22 | Discharge: 2022-04-22 | Disposition: A | Discharge status: Home / Self Care | Payer: 59 | Source: Ambulatory Visit | Attending: Urology | Admitting: Urology

## 2022-04-22 DIAGNOSIS — N200 Calculus of kidney: Secondary | ICD-10-CM

## 2022-12-07 ENCOUNTER — Encounter (INDEPENDENT_AMBULATORY_CARE_PROVIDER_SITE_OTHER): Payer: Self-pay | Admitting: Student in an Organized Health Care Education/Training Program

## 2023-02-24 ENCOUNTER — Encounter (INDEPENDENT_AMBULATORY_CARE_PROVIDER_SITE_OTHER): Payer: 59 | Admitting: Student in an Organized Health Care Education/Training Program

## 2023-03-24 ENCOUNTER — Encounter (INDEPENDENT_AMBULATORY_CARE_PROVIDER_SITE_OTHER): Payer: 59 | Admitting: Student in an Organized Health Care Education/Training Program

## 2023-04-14 ENCOUNTER — Encounter (INDEPENDENT_AMBULATORY_CARE_PROVIDER_SITE_OTHER): Payer: Self-pay | Admitting: UROLOGY

## 2024-04-10 ENCOUNTER — Encounter (INDEPENDENT_AMBULATORY_CARE_PROVIDER_SITE_OTHER): Payer: Self-pay | Admitting: Physician Assistant

## 2024-04-10 ENCOUNTER — Encounter (INDEPENDENT_AMBULATORY_CARE_PROVIDER_SITE_OTHER): Payer: Self-pay
